# Patient Record
Sex: Male | Born: 1970 | Race: Black or African American | Hispanic: No | Marital: Single | Smoking: Current every day smoker
Health system: Southern US, Community
[De-identification: ages and names within clinical notes are randomized; demographics above are authoritative.]

---

## 2012-04-29 ENCOUNTER — Emergency Department: Payer: Self-pay | Admitting: Emergency Medicine

## 2012-04-29 LAB — COMPREHENSIVE METABOLIC PANEL
Alkaline Phosphatase: 79 U/L (ref 50–136)
Anion Gap: 12 (ref 7–16)
Bilirubin,Total: 0.4 mg/dL (ref 0.2–1.0)
Calcium, Total: 8.7 mg/dL (ref 8.5–10.1)
Chloride: 103 mmol/L (ref 98–107)
Co2: 23 mmol/L (ref 21–32)
EGFR (African American): >60
EGFR (Non-African Amer.): >60
SGOT(AST): 25 U/L (ref 15–37)
SGPT (ALT): 46 U/L (ref 12–78)

## 2012-04-29 LAB — URINALYSIS, COMPLETE
Bacteria: NONE SEEN
Bilirubin,UR: NEGATIVE
Blood: NEGATIVE
Glucose,UR: NEGATIVE mg/dL (ref 0–75)
Ketone: NEGATIVE
Leukocyte Esterase: NEGATIVE
Nitrite: NEGATIVE
Ph: 6 (ref 4.5–8.0)
Protein: NEGATIVE
Specific Gravity: 1.001 (ref 1.003–1.030)
Squamous Epithelial: 1
WBC UR: 1 /HPF (ref 0–5)

## 2012-04-29 LAB — CBC WITH DIFFERENTIAL/PLATELET
Basophil #: 0.1 10*3/uL (ref 0.0–0.1)
Eosinophil #: 0.2 10*3/uL (ref 0.0–0.7)
Lymphocyte #: 2.9 10*3/uL (ref 1.0–3.6)
MCHC: 34 g/dL (ref 32.0–36.0)
MCV: 83 fL (ref 80–100)
Monocyte #: 1 x10 3/mm (ref 0.2–1.0)
Neutrophil #: 5.2 10*3/uL (ref 1.4–6.5)
RBC: 5.65 10*6/uL (ref 4.40–5.90)
RDW: 13.6 % (ref 11.5–14.5)
WBC: 9.4 10*3/uL (ref 3.8–10.6)

## 2014-06-27 ENCOUNTER — Emergency Department: Payer: Self-pay | Admitting: Emergency Medicine

## 2014-07-04 ENCOUNTER — Emergency Department: Payer: Self-pay | Admitting: Student

## 2015-05-01 ENCOUNTER — Emergency Department
Admission: EM | Admit: 2015-05-01 | Discharge: 2015-05-01 | Disposition: A | Discharge status: Home / Self Care | Payer: No Typology Code available for payment source | Attending: Emergency Medicine | Admitting: Emergency Medicine

## 2015-05-01 DIAGNOSIS — K029 Dental caries, unspecified: Secondary | ICD-10-CM

## 2015-05-01 DIAGNOSIS — Z720 Tobacco use: Secondary | ICD-10-CM | POA: Diagnosis not present

## 2015-05-01 DIAGNOSIS — K047 Periapical abscess without sinus: Secondary | ICD-10-CM | POA: Diagnosis not present

## 2015-05-01 DIAGNOSIS — K088 Other specified disorders of teeth and supporting structures: Secondary | ICD-10-CM | POA: Diagnosis present

## 2015-05-01 MED ORDER — PENICILLIN V POTASSIUM 500 MG PO TABS
500.0000 mg | ORAL_TABLET | Freq: Four times a day (QID) | ORAL | Status: DC
Start: 2015-05-01 — End: 2021-02-01

## 2015-05-01 MED ORDER — TRAMADOL HCL 50 MG PO TABS: 50.0000 mg | ORAL_TABLET | Freq: Four times a day (QID) | ORAL | Status: DC | PRN

## 2015-05-01 MED ORDER — IBUPROFEN 800 MG PO TABS: 800.0000 mg | ORAL_TABLET | Freq: Three times a day (TID) | ORAL | Status: DC | PRN

## 2015-05-01 MED ORDER — BUPIVACAINE HCL (PF) 0.5 % IJ SOLN
INTRAMUSCULAR | Status: AC
  Filled 2015-05-01: qty 30

## 2015-05-01 MED ORDER — LIDOCAINE HCL (PF) 1 % IJ SOLN
2.0000 mL | Freq: Once | INTRAMUSCULAR | Status: DC
  Filled 2015-05-01: qty 5

## 2015-05-01 MED ORDER — BUPIVACAINE HCL 0.5 % IJ SOLN
50.0000 mL | Freq: Once | INTRAMUSCULAR | Status: DC
  Filled 2015-05-01: qty 50

## 2015-05-01 NOTE — ED Provider Notes (Signed)
CSN: [id number]     Arrival date & time 05/01/15  1940 History   First MD Initiated Contact with Patient 05/01/15 2037     Chief Complaint  Patient presents with  . Dental Pain     (Consider location/radiation/quality/duration/timing/severity/associated sxs/prior Treatment) HPI  44 year old male presents to emergency department for evaluation of dental pain. Patient states tooth #36 has been causing significant pain and discomfort last 2 days. 2. Previously fractured and has caused 2 days of pain and swelling. He has not been taking any medications. Is able tolerate by mouth well. No fevers does not have a dentist. Pain is 10 out of 10.  No past medical history on file. No past surgical history on file. No family history on file. Social History  Substance Use Topics  . Smoking status: Current Every Day Smoker  . Smokeless tobacco: Not on file  . Alcohol Use: No    Review of Systems  Constitutional: Negative.  Negative for fever and chills.  HENT: Positive for dental problem and facial swelling. Negative for drooling, mouth sores, trouble swallowing and voice change.   Respiratory: Negative for chest tightness and shortness of breath.   Cardiovascular: Negative for chest pain.  Gastrointestinal: Negative for nausea, vomiting, abdominal pain and diarrhea.  Musculoskeletal: Negative for arthralgias, neck pain and neck stiffness.  Skin: Negative.   Psychiatric/Behavioral: Negative for confusion.  All other systems reviewed and are negative.     Allergies  Review of patient's allergies indicates no known allergies.  Home Medications   Prior to Admission medications   Medication Sig Start Date End Date Taking? Authorizing Provider  ibuprofen (ADVIL,MOTRIN) 800 MG tablet Take 1 tablet (800 mg total) by mouth every 8 (eight) hours as needed. 05/01/15   Evon Slack, PA-C  penicillin v potassium (VEETID) 500 MG tablet Take 1 tablet (500 mg total) by mouth 4 (four) times daily.  05/01/15   Evon Slack, PA-C  traMADol (ULTRAM) 50 MG tablet Take 1 tablet (50 mg total) by mouth every 6 (six) hours as needed. 05/01/15   Evon Slack, PA-C   BP 137/96 mmHg  Pulse 82  Temp(Src) 98.4 F (36.9 C) (Oral)  Resp 18  Ht  (1.93 m)  Wt 240 lb (108.863 kg)  BMI 29.23 kg/m2  SpO2 97% Physical Exam  Constitutional: He is oriented to person, place, and time. He appears well-developed and well-nourished. No distress.  HENT:  Head: Normocephalic and atraumatic.  Right Ear: External ear normal.  Left Ear: External ear normal.  Nose: Nose normal.  Mouth/Throat: Uvula is midline and oropharynx is clear and moist. No oral lesions. No trismus in the jaw. Normal dentition. Dental abscesses and dental caries present. No uvula swelling. No oropharyngeal exudate, posterior oropharyngeal edema, posterior oropharyngeal erythema or tonsillar abscesses.    Eyes: EOM are normal.  Neck: Normal range of motion. Neck supple.  Cardiovascular: Normal rate.  Exam reveals no gallop and no friction rub.   No murmur heard. Pulmonary/Chest: Effort normal and breath sounds normal. No respiratory distress.  Neurological: He is alert and oriented to person, place, and time.  Skin: Skin is warm and dry.  Psychiatric: He has a normal mood and affect. His behavior is normal. Thought content normal.    ED Course  Procedures (including critical care time) Labs Review Labs Reviewed - No data to display  Imaging Review No results found. I have personally reviewed and evaluated these images and lab results as part of my  medical decision-making.   EKG Interpretation None      MDM   Final diagnoses:  Pain due to dental caries    44 year old male with dental pain 2 days. He was offered dental block but refused. Patient given penicillin VK, tramadol, ibuprofen. Follow-up with dental clinic first of next week. Return to the ER for any worsening symptoms urgent changes in  health.    Evon Slack, PA-C 05/01/15 2108  Richardean Canal, MD 05/01/15 [phone]

## 2015-05-01 NOTE — ED Notes (Signed)
Dental pain x 4 days, R lower jaw.

## 2015-05-01 NOTE — ED Notes (Signed)
Patient with no complaints at this time. Respirations even and unlabored. Skin warm/dry. Discharge instructions reviewed with patient at this time. Patient given opportunity to voice concerns/ask questions. Patient discharged at this time and left Emergency Department with steady gait.   

## 2015-05-01 NOTE — ED Notes (Signed)
Patient presents to ED with complaint of toothache x 4 days. Points to right lower molar area. No facial swelling noted. Unsure about fever at home. Denies regular dental care.

## 2015-05-01 NOTE — Discharge Instructions (Signed)
Dental Care and Dentist Visits Dental care supports good overall health. Regular dental visits can also help you avoid dental pain, bleeding, infection, and other more serious health problems in the future. It is important to keep the mouth healthy because diseases in the teeth, gums, and other oral tissues can spread to other areas of the body. Some problems, such as diabetes, heart disease, and pre-term labor have been associated with poor oral health.  See your dentist every 6 months. If you experience emergency problems such as a toothache or broken tooth, go to the dentist right away. If you see your dentist regularly, you may catch problems early. It is easier to be treated for problems in the early stages.  WHAT TO EXPECT AT A DENTIST VISIT  Your dentist will look for many common oral health problems and recommend proper treatment. At your regular dental visit, you can expect:  Gentle cleaning of the teeth and gums. This includes scraping and polishing. This helps to remove the sticky substance around the teeth and gums (plaque). Plaque forms in the mouth shortly after eating. Over time, plaque hardens on the teeth as tartar. If tartar is not removed regularly, it can cause problems. Cleaning also helps remove stains.  Periodic X-rays. These pictures of the teeth and supporting bone will help your dentist assess the health of your teeth.  Periodic fluoride treatments. Fluoride is a natural mineral shown to help strengthen teeth. Fluoride treatmentinvolves applying a fluoride gel or varnish to the teeth. It is most commonly done in children.  Examination of the mouth, tongue, jaws, teeth, and gums to look for any oral health problems, such as:  Cavities (dental caries). This is decay on the tooth caused by plaque, sugar, and acid in the mouth. It is best to catch a cavity when it is small.  Inflammation of the gums caused by plaque buildup (gingivitis).  Problems with the mouth or malformed  or misaligned teeth.  Oral cancer or other diseases of the soft tissues or jaws. KEEP YOUR TEETH AND GUMS HEALTHY For healthy teeth and gums, follow these general guidelines as well as your dentist's specific advice:  Have your teeth professionally cleaned at the dentist every 6 months.  Brush twice daily with a fluoride toothpaste.  Floss your teeth daily.  Ask your dentist if you need fluoride supplements, treatments, or fluoride toothpaste.  Eat a healthy diet. Reduce foods and drinks with added sugar.  Avoid smoking. TREATMENT FOR ORAL HEALTH PROBLEMS If you have oral health problems, treatment varies depending on the conditions present in your teeth and gums.  Your caregiver will most likely recommend good oral hygiene at each visit.  For cavities, gingivitis, or other oral health disease, your caregiver will perform a procedure to treat the problem. This is typically done at a separate appointment. Sometimes your caregiver will refer you to another dental specialist for specific tooth problems or for surgery. SEEK IMMEDIATE DENTAL CARE IF:  You have pain, bleeding, or soreness in the gum, tooth, jaw, or mouth area.  A permanent tooth becomes loose or separated from the gum socket.  You experience a blow or injury to the mouth or jaw area. Document Released: 04/06/2011 Document Revised: 10/17/2011 Document Reviewed: 04/06/2011 [facility] Patient Information 2015 [employer], [place]. This information is not intended to replace advice given to you by your health care provider. Make sure you discuss any questions you have with your health care provider.  Dental Caries Dental caries is tooth decay. This  decay can cause a hole in teeth (cavity) that can get bigger and deeper over time. °HOME CARE °· Brush and floss your teeth. Do this at least two times a day. °· Use a fluoride toothpaste. °· Use a mouth rinse if told by your dentist or doctor. °· Eat less sugary and starchy foods.  Drink less sugary drinks. °· Avoid snacking often on sugary and starchy foods. Avoid sipping often on sugary drinks. °· Keep regular checkups and cleanings with your dentist. °· Use fluoride supplements if told by your dentist or doctor. °· Allow fluoride to be applied to teeth if told by your dentist or doctor. °Document Released: 05/03/2008 Document Revised: 12/09/2013 Document Reviewed: 07/27/2012 °ExitCare® Patient Information ©2015 [employer]. This information is not intended to replace advice given to you by your health care provider. Make sure you discuss any questions you have with your health care provider. ° °Dental Pain °A tooth ache may be caused by cavities (tooth decay). Cavities expose the nerve of the tooth to air and hot or cold temperatures. It may come from an infection or abscess (also called a boil or furuncle) around your tooth. It is also often caused by dental caries (tooth decay). This causes the pain you are having. °DIAGNOSIS  °Your caregiver can diagnose this problem by exam. °TREATMENT  °· If caused by an infection, it may be treated with medications which kill germs (antibiotics) and pain medications as prescribed by your caregiver. Take medications as directed. °· Only take over-the-counter or prescription medicines for pain, discomfort, or fever as directed by your caregiver. °· Whether the tooth ache today is caused by infection or dental disease, you should see your dentist as soon as possible for further care. °SEEK MEDICAL CARE IF: °The exam and treatment you received today has been provided on an emergency basis only. This is not a substitute for complete medical or dental care. If your problem worsens or new problems (symptoms) appear, and you are unable to meet with your dentist, call or return to this location. °SEEK IMMEDIATE MEDICAL CARE IF:  °· You have a fever. °· You develop redness and swelling of your face, jaw, or neck. °· You are unable to open your mouth. °· You  have severe pain uncontrolled by pain medicine. °MAKE SURE YOU:  °· Understand these instructions. °· Will watch your condition. °· Will get help right away if you are not doing well or get worse. °Document Released: 07/25/2005 Document Revised: 10/17/2011 Document Reviewed: 03/12/2008 °ExitCare® Patient Information ©2015 [employer]. This information is not intended to replace advice given to you by your health care provider. Make sure you discuss any questions you have with your health care provider. ° °

## 2017-01-01 ENCOUNTER — Emergency Department: Payer: Self-pay

## 2017-01-01 ENCOUNTER — Encounter: Payer: Self-pay | Admitting: Emergency Medicine

## 2017-01-01 ENCOUNTER — Emergency Department
Admission: EM | Admit: 2017-01-01 | Discharge: 2017-01-01 | Disposition: A | Discharge status: Home / Self Care | Payer: Self-pay | Attending: Emergency Medicine | Admitting: Emergency Medicine

## 2017-01-01 DIAGNOSIS — F17200 Nicotine dependence, unspecified, uncomplicated: Secondary | ICD-10-CM | POA: Insufficient documentation

## 2017-01-01 DIAGNOSIS — M25562 Pain in left knee: Secondary | ICD-10-CM

## 2017-01-01 DIAGNOSIS — Z23 Encounter for immunization: Secondary | ICD-10-CM | POA: Insufficient documentation

## 2017-01-01 DIAGNOSIS — S80252A Superficial foreign body, left knee, initial encounter: Secondary | ICD-10-CM

## 2017-01-01 DIAGNOSIS — L089 Local infection of the skin and subcutaneous tissue, unspecified: Secondary | ICD-10-CM

## 2017-01-01 DIAGNOSIS — W228XXD Striking against or struck by other objects, subsequent encounter: Secondary | ICD-10-CM | POA: Insufficient documentation

## 2017-01-01 DIAGNOSIS — M25462 Effusion, left knee: Secondary | ICD-10-CM

## 2017-01-01 MED ORDER — CEPHALEXIN 500 MG PO CAPS: 500.0000 mg | ORAL_CAPSULE | Freq: Four times a day (QID) | ORAL | 0 refills | Status: AC

## 2017-01-01 MED ORDER — TETANUS-DIPHTH-ACELL PERTUSSIS 5-2.5-18.5 LF-MCG/0.5 IM SUSP
0.5000 mL | Freq: Once | INTRAMUSCULAR | Status: AC
  Administered 2017-01-01: 0.5 mL via INTRAMUSCULAR
  Filled 2017-01-01: qty 0.5

## 2017-01-01 NOTE — ED Triage Notes (Signed)
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

## 2017-01-01 NOTE — ED Provider Notes (Signed)
[facility] Emergency Department Provider Note   ____________________________________________   I have reviewed the triage vital signs and the nursing notes.   HISTORY  Chief Complaint Knee Pain and Joint Swelling    HPI Clayton Graham is a 46 y.o. male presented with left knee pain, swelling, erythema, and difficulty ranging his knee secondary to previous symptoms over the last 3 days. Patient denies any recent trauma to the left knee and has no remote history of any injury. Patient is a [employer] architecttruck driver and is unsure of when he hit his knee if that caused the symptoms. Patient recalls approximately 1 week ago getting a metal splinter in his right upper thigh and removing it without receiving any treatment. Patient denies history of gout, rheumatoid arthritis, no recent illicit drug use, no recent surgeries. Patient denies fever, chills, headache, vision changes, chest pain, chest tightness, shortness of breath, abdominal pain, nausea and vomiting.  History reviewed. No pertinent past medical history.  There are no active problems to display for this patient.   History reviewed. No pertinent surgical history.  Prior to Admission medications   Medication Sig Start Date End Date Taking? Authorizing Provider  cephALEXin (KEFLEX) 500 MG capsule Take 1 capsule (500 mg total) by mouth 4 (four) times daily. 01/01/17 01/11/17  Sharnell Knight M, PA-C  ibuprofen (ADVIL,MOTRIN) 800 MG tablet Take 1 tablet (800 mg total) by mouth every 8 (eight) hours as needed. 05/01/15   Evon SlackGaines, Thomas C, PA-C  penicillin v potassium (VEETID) 500 MG tablet Take 1 tablet (500 mg total) by mouth 4 (four) times daily. 05/01/15   Evon SlackGaines, Thomas C, PA-C  traMADol (ULTRAM) 50 MG tablet Take 1 tablet (50 mg total) by mouth every 6 (six) hours as needed. 05/01/15   Evon SlackGaines, Thomas C, PA-C    Allergies Patient has no known allergies.  History reviewed. No pertinent family history.  Social  History Social History  Substance Use Topics  . Smoking status: Current Every Day Smoker  . Smokeless tobacco: Never Used  . Alcohol use No    Review of Systems Constitutional:  Negative for fever/chills Eyes: No visual changes. Cardiovascular: Denies chest pain. Respiratory: Denies cough Denies shortness of breath. Gastrointestinal: No abdominal pain.  No nausea, vomiting, diarrhea. Genitourinary: Negative for dysuria. Musculoskeletal: Left knee pain, swelling. Skin: Negative for rash. Left lower extremity erythematous, warm to the touch. ____________________________________________   PHYSICAL EXAM:  VITAL SIGNS: ED Triage Vitals  Enc Vitals Group     BP 01/01/17 1114 (!) 149/88     Pulse Rate 01/01/17 1114 72     Resp 01/01/17 1114 18     Temp 01/01/17 1114 98.5 F (36.9 C)     Temp Source 01/01/17 1114 Oral     SpO2 01/01/17 1114 96 %     Weight 01/01/17 1114 250 lb (113.4 kg)     Height 01/01/17 1114 6\' 4"  (1.93 m)     Head Circumference --      Peak Flow --      Pain Score 01/01/17 1113 7     Pain Loc --      Pain Edu? --      Excl. in GC? --     Constitutional: Alert and oriented. Well appearing and in no acute distress.  Head: Normocephalic and atraumatic. Eyes: Conjunctivae are normal.  Cardiovascular: Normal rate, regular rhythm. Normal distal pulses. Respiratory: Normal respiratory effort.  Musculoskeletal: Right knee range of motion limited by pain and knee  joint effusion. Visible swelling along along left knee and lower leg. Neurologic: Normal speech and language. No gross focal neurologic deficits are appreciated. Skin:  Skin is dry and intact. No rash noted. Left lower extremity approximately knee joint down very warm to the touch. ____________________________________________   LABS (all labs ordered are listed, but only abnormal results are displayed)  Labs Reviewed - No data to  display ____________________________________________  EKG none ____________________________________________  RADIOLOGY DG left knee complete FINDINGS: No evidence of fracture, dislocation, or joint effusion. Minimal narrowing of medial joint space is noted. Soft tissues are unremarkable.  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 No acute abnormality seen the left knee. ____________________________________________   PROCEDURES  Procedure(s) performed: no   Critical Care performed: no ____________________________________________   INITIAL IMPRESSION / ASSESSMENT AND PLAN / ED COURSE  Pertinent labs & imaging results that were available during my care of the patient were reviewed by me and considered in my medical decision making (see chart for details).  Patient presents with left knee pain, visible swelling, joint effusion and erythema along the left lower extremity. Symptoms are likely associated with infection in the left knee joint. Patient reported approximate one week ago removing a metal splinter out of the thigh area of the left leg without receiving treatment or tetanus booster. Patient will be empirically treated with cephalexin. Patient received a tetanus booster today. Physical exam and imaging are reassuring for no acute fractures or significant arthritic changes.. Highly encourage patient to continue monitoring the left lower extremity for worsening of symptoms. Patient informed of clinical course, understand medical decision-making process, and agree with plan.  Patient was advised to follow up with PCP and was also advised to return to the emergency department for symptoms that change or worsen.      ____________________________________________   FINAL CLINICAL IMPRESSION(S) / ED DIAGNOSES  Final diagnoses:  Acute pain of left knee  Superficial foreign body of left knee with infection, initial encounter  Effusion of left knee        NEW MEDICATIONS STARTED DURING THIS VISIT:  Discharge Medication List as of 01/01/2017 12:40 PM    START taking these medications   Details  cephALEXin (KEFLEX) 500 MG capsule Take 1 capsule (500 mg total) by mouth 4 (four) times daily., Starting Sun 01/01/2017, Until Wed 01/11/2017, Print         Note:  This document was prepared using Dragon voice recognition software and may include unintentional dictation errors.   Jourdyn Hasler, Karl Pock 01/01/17 2037    Phineas Semen, MD 01/08/17 [phone]

## 2018-01-15 IMAGING — CR DG KNEE COMPLETE 4+V*L*
1 series · 4 of 4 positions shown · non-contrast
Comparison: None.

CLINICAL DATA: Left knee pain and swelling for 1 week without known
injury.

EXAM:
LEFT KNEE - COMPLETE 4+ VIEW

[Series 1: dg knee complete 4 views left · 0.14mm/px · 4 of 4 slices shown]
[im 1/4]
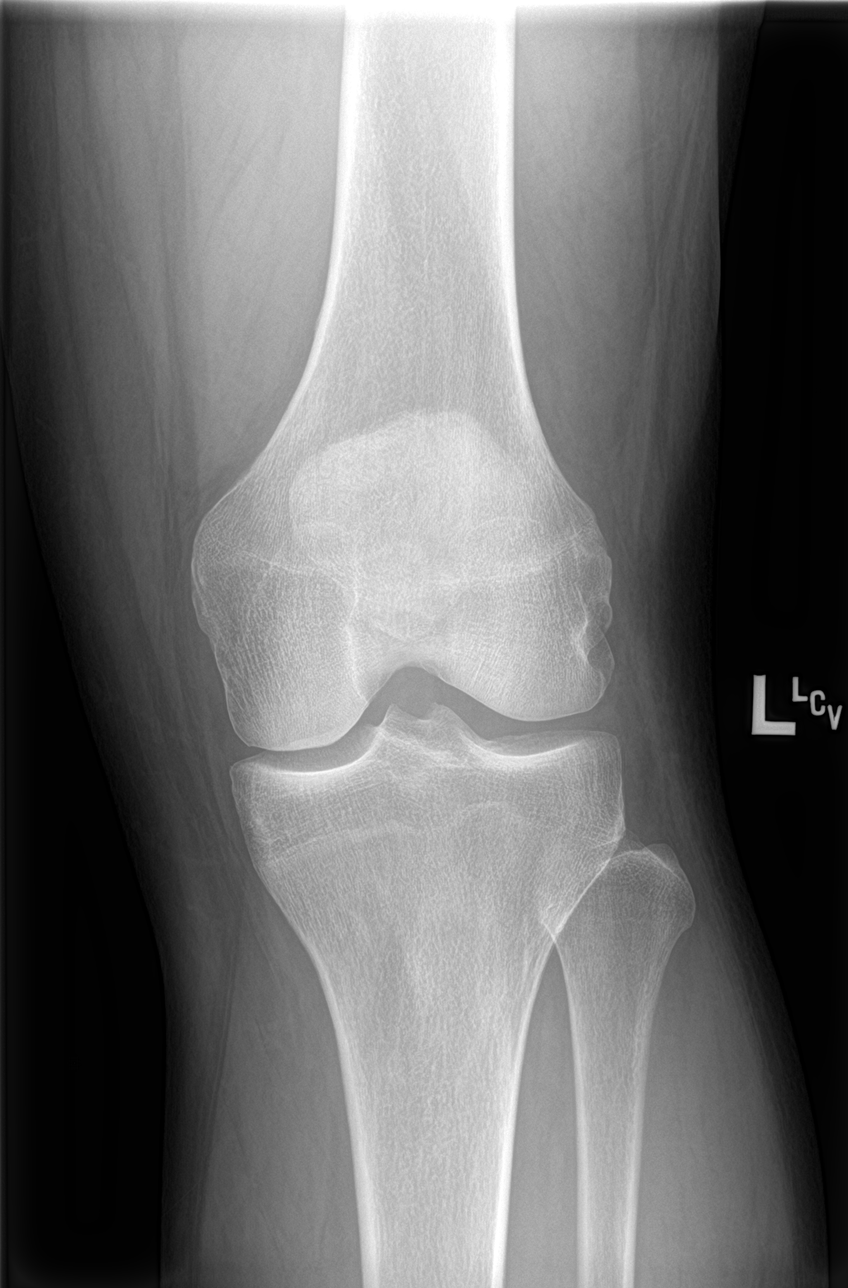
[im 2/4]
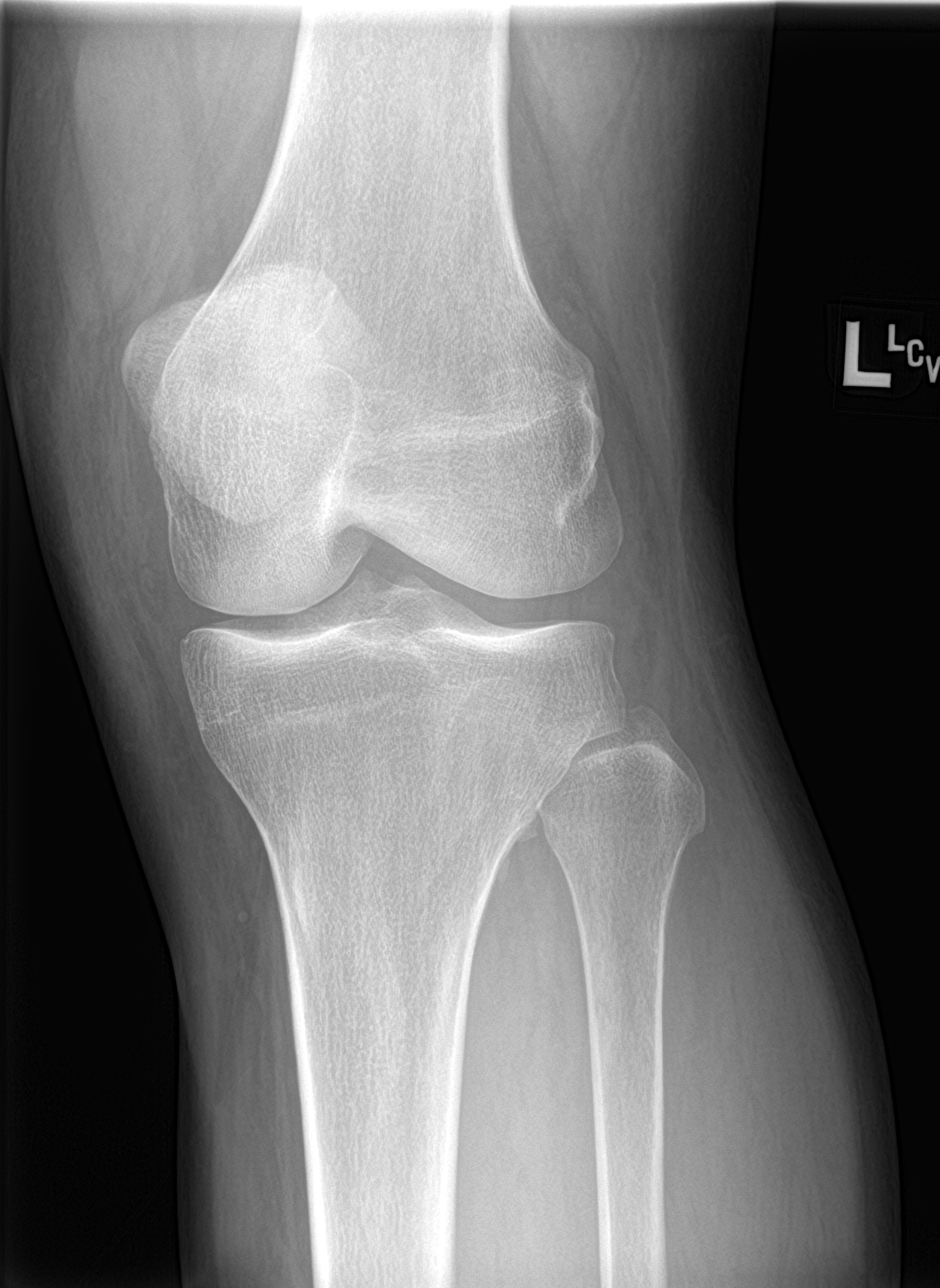
[im 3/4]
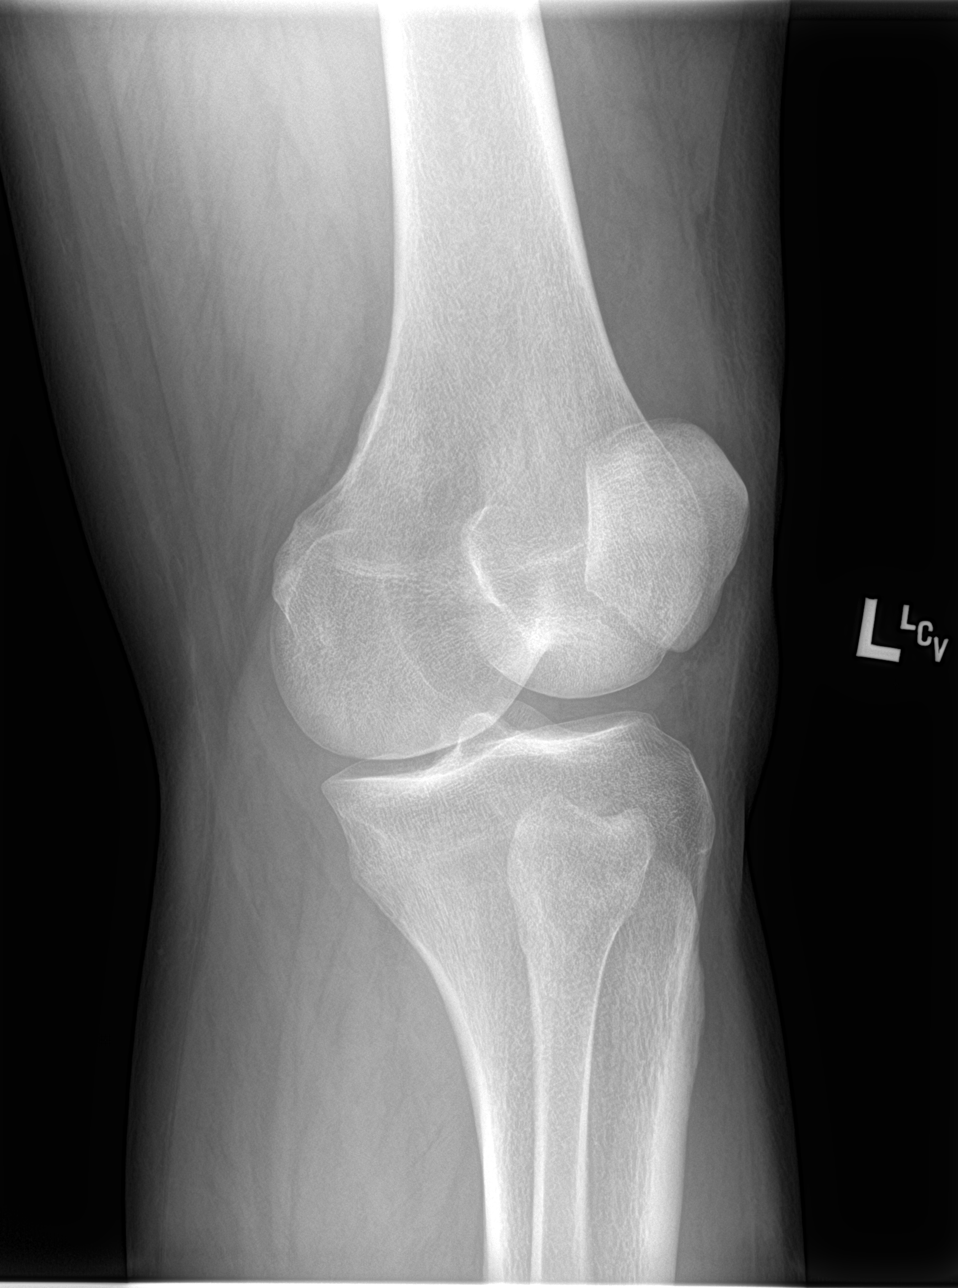
[im 4/4]
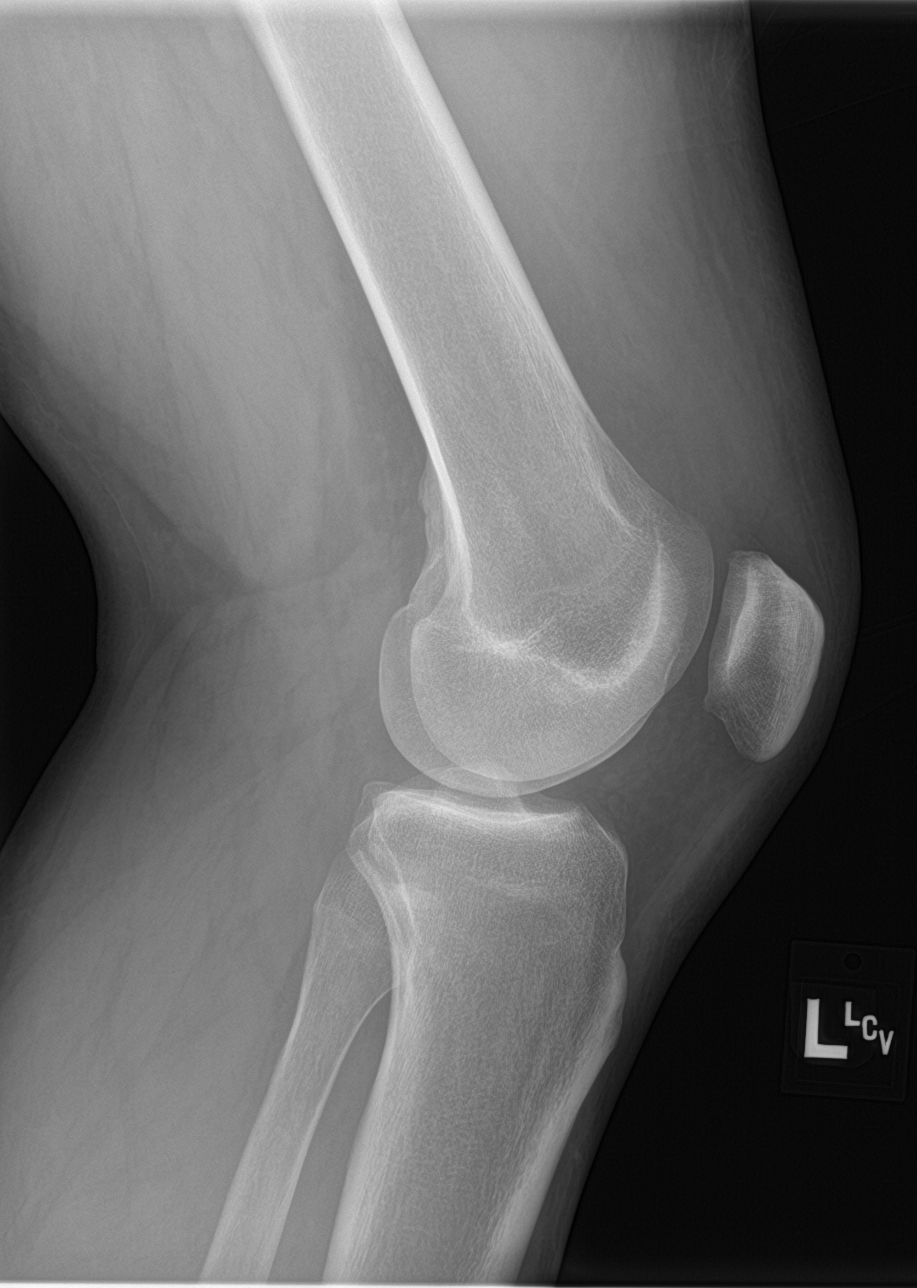

[4 of 4 positions shown; findings below may reference images not displayed]

FINDINGS: No evidence of fracture, dislocation, or joint effusion. Minimal
narrowing of medial joint space is noted. Soft tissues are
unremarkable.
IMPRESSION: Minimal degenerative joint disease is noted medially. No acute
abnormality seen the left knee.

## 2018-09-23 ENCOUNTER — Emergency Department
Admission: EM | Admit: 2018-09-23 | Discharge: 2018-09-23 | Disposition: A | Discharge status: Home / Self Care | Payer: Self-pay | Attending: Student in an Organized Health Care Education/Training Program | Admitting: Student in an Organized Health Care Education/Training Program

## 2018-09-23 ENCOUNTER — Other Ambulatory Visit: Payer: Self-pay

## 2018-09-23 ENCOUNTER — Emergency Department: Payer: Self-pay

## 2018-09-23 DIAGNOSIS — F17210 Nicotine dependence, cigarettes, uncomplicated: Secondary | ICD-10-CM | POA: Insufficient documentation

## 2018-09-23 DIAGNOSIS — S0990XA Unspecified injury of head, initial encounter: Secondary | ICD-10-CM | POA: Insufficient documentation

## 2018-09-23 DIAGNOSIS — Y92008 Other place in unspecified non-institutional (private) residence as the place of occurrence of the external cause: Secondary | ICD-10-CM | POA: Insufficient documentation

## 2018-09-23 DIAGNOSIS — S0101XA Laceration without foreign body of scalp, initial encounter: Secondary | ICD-10-CM | POA: Insufficient documentation

## 2018-09-23 DIAGNOSIS — Y939 Activity, unspecified: Secondary | ICD-10-CM | POA: Insufficient documentation

## 2018-09-23 DIAGNOSIS — Y999 Unspecified external cause status: Secondary | ICD-10-CM | POA: Insufficient documentation

## 2018-09-23 DIAGNOSIS — Z23 Encounter for immunization: Secondary | ICD-10-CM | POA: Insufficient documentation

## 2018-09-23 DIAGNOSIS — Y33XXXA Other specified events, undetermined intent, initial encounter: Secondary | ICD-10-CM | POA: Insufficient documentation

## 2018-09-23 LAB — CBC WITH DIFFERENTIAL/PLATELET
Abs Immature Granulocytes: 0.07 10*3/uL (ref 0.00–0.07)
Basophils Absolute: 0.1 10*3/uL (ref 0.0–0.1)
Basophils Relative: 1 %
Eosinophils Absolute: 0.3 10*3/uL (ref 0.0–0.5)
Eosinophils Relative: 3 %
HCT: 47.7 % (ref 39.0–52.0)
Hemoglobin: 15.3 g/dL (ref 13.0–17.0)
Immature Granulocytes: 1 %
Lymphocytes Relative: 43 %
Lymphs Abs: 3.4 10*3/uL (ref 0.7–4.0)
MCH: 26.4 pg (ref 26.0–34.0)
MCHC: 32.1 g/dL (ref 30.0–36.0)
MCV: 82.2 fL (ref 80.0–100.0)
Monocytes Absolute: 0.9 10*3/uL (ref 0.1–1.0)
Monocytes Relative: 11 %
Neutro Abs: 3.3 10*3/uL (ref 1.7–7.7)
Neutrophils Relative %: 41 %
Platelets: 344 10*3/uL (ref 150–400)
RBC: 5.8 MIL/uL (ref 4.22–5.81)
RDW: 14 % (ref 11.5–15.5)
WBC: 7.9 10*3/uL (ref 4.0–10.5)
nRBC: 0 % (ref 0.0–0.2)

## 2018-09-23 LAB — COMPREHENSIVE METABOLIC PANEL
ALT: 41 U/L (ref 0–44)
AST: 23 U/L (ref 15–41)
Albumin: 4.3 g/dL (ref 3.5–5.0)
Alkaline Phosphatase: 60 U/L (ref 38–126)
Anion gap: 7 (ref 5–15)
BUN: 11 mg/dL (ref 6–20)
CO2: 26 mmol/L (ref 22–32)
Calcium: 9.3 mg/dL (ref 8.9–10.3)
Chloride: 105 mmol/L (ref 98–111)
Creatinine, Ser: 0.88 mg/dL (ref 0.61–1.24)
GFR calc Af Amer: >60 mL/min (ref >60)
GFR calc non Af Amer: >60 mL/min (ref >60)
Glucose, Bld: 99 mg/dL (ref 70–99)
Potassium: 3.7 mmol/L (ref 3.5–5.1)
Sodium: 138 mmol/L (ref 135–145)
Total Bilirubin: 0.7 mg/dL (ref 0.3–1.2)
Total Protein: 7.9 g/dL (ref 6.5–8.1)

## 2018-09-23 LAB — TYPE AND SCREEN
ABO/RH(D): O POS
Antibody Screen: NEGATIVE

## 2018-09-23 LAB — URINE DRUG SCREEN, QUALITATIVE (ARMC ONLY)
Amphetamines, Ur Screen: NOT DETECTED
Barbiturates, Ur Screen: NOT DETECTED
Benzodiazepine, Ur Scrn: NOT DETECTED
Cannabinoid 50 Ng, Ur ~~LOC~~: NOT DETECTED
Cocaine Metabolite,Ur ~~LOC~~: NOT DETECTED
MDMA (Ecstasy)Ur Screen: NOT DETECTED
Methadone Scn, Ur: NOT DETECTED
Opiate, Ur Screen: NOT DETECTED
Phencyclidine (PCP) Ur S: NOT DETECTED
Tricyclic, Ur Screen: NOT DETECTED

## 2018-09-23 MED ORDER — IBUPROFEN 800 MG PO TABS: 800.0000 mg | ORAL_TABLET | Freq: Three times a day (TID) | ORAL | 0 refills | Status: DC | PRN

## 2018-09-23 MED ORDER — CEPHALEXIN 500 MG PO CAPS: 500.0000 mg | ORAL_CAPSULE | Freq: Three times a day (TID) | ORAL | 0 refills | Status: DC

## 2018-09-23 MED ORDER — OXYCODONE-ACETAMINOPHEN 5-325 MG PO TABS
1.0000 | ORAL_TABLET | Freq: Once | ORAL | Status: DC
  Filled 2018-09-23: qty 1

## 2018-09-23 MED ORDER — TETANUS-DIPHTH-ACELL PERTUSSIS 5-2.5-18.5 LF-MCG/0.5 IM SUSP
0.5000 mL | Freq: Once | INTRAMUSCULAR | Status: AC
  Administered 2018-09-23: 0.5 mL via INTRAMUSCULAR
  Filled 2018-09-23: qty 0.5

## 2018-09-23 MED ORDER — ONDANSETRON 4 MG PO TBDP
4.0000 mg | ORAL_TABLET | Freq: Once | ORAL | Status: DC
  Filled 2018-09-23: qty 1

## 2018-09-23 MED ORDER — KETOROLAC TROMETHAMINE 30 MG/ML IJ SOLN
30.0000 mg | Freq: Once | INTRAMUSCULAR | Status: AC
  Administered 2018-09-23: 30 mg via INTRAMUSCULAR
  Filled 2018-09-23: qty 1

## 2018-09-23 MED ORDER — LIDOCAINE HCL 1 % IJ SOLN
5.0000 mL | Freq: Once | INTRAMUSCULAR | Status: AC
  Administered 2018-09-23: 5 mL

## 2018-09-23 MED ORDER — LIDOCAINE HCL (PF) 1 % IJ SOLN
INTRAMUSCULAR | Status: AC
  Filled 2018-09-23: qty 5

## 2018-09-23 NOTE — ED Notes (Signed)
Sister denies use of drugs or hx of drugs. States pt is just nervous.   Pt states he was home alone sleeping on couch and woke up with head bleeding. Lives in an apartment by self. Pt called his mom who brought him to ED.  Multiple family members in room. Informed only 2 at a time. Mom and sister remain at bedside. Cousins went to lobby at this time.

## 2018-09-23 NOTE — ED Notes (Signed)
Police in room speaking with pt.

## 2018-09-23 NOTE — ED Triage Notes (Signed)
Pt states he was sleeping on cough and woke up and posterior head was bleeding. Denies falling. Denies being hit in head. Denies blood thinner use. A&O, in wheelchair.
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
## 2018-09-23 NOTE — ED Notes (Signed)
Patient transported to CT 

## 2021-02-01 ENCOUNTER — Ambulatory Visit
Admission: EM | Admit: 2021-02-01 | Discharge: 2021-02-01 | Disposition: A | Discharge status: Home / Self Care | Payer: Self-pay | Attending: Emergency Medicine | Admitting: Emergency Medicine

## 2021-02-01 ENCOUNTER — Other Ambulatory Visit: Payer: Self-pay

## 2021-02-01 DIAGNOSIS — S5002XA Contusion of left elbow, initial encounter: Secondary | ICD-10-CM

## 2021-02-01 DIAGNOSIS — S39012A Strain of muscle, fascia and tendon of lower back, initial encounter: Secondary | ICD-10-CM

## 2021-02-01 DIAGNOSIS — S161XXA Strain of muscle, fascia and tendon at neck level, initial encounter: Secondary | ICD-10-CM

## 2021-02-01 MED ORDER — BACLOFEN 10 MG PO TABS: 10.0000 mg | ORAL_TABLET | Freq: Three times a day (TID) | ORAL | 0 refills | Status: DC

## 2021-02-01 MED ORDER — TRAMADOL HCL 50 MG PO TABS: 100.0000 mg | ORAL_TABLET | Freq: Four times a day (QID) | ORAL | 0 refills | Status: DC | PRN

## 2021-02-01 MED ORDER — IBUPROFEN 600 MG PO TABS: 600.0000 mg | ORAL_TABLET | Freq: Four times a day (QID) | ORAL | 0 refills | Status: DC | PRN

## 2021-02-01 NOTE — Discharge Instructions (Addendum)
Take the ibuprofen, 600 mg every 6 hours with food, on a schedule for the next 48 hours and then back off to as-needed basis.  Use the baclofen, 10 mg every 8 hours, to help with muscle spasm.  Also taken on a schedule for the next 48 hours and then as needed.  Use the tramadol as needed for severe pain.  Increase your oral fluid intake to help keep your secretions thin and help your body clear the lactic acid from your muscles.  Follow the back exercises given at discharge.
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
## 2021-02-05 ENCOUNTER — Other Ambulatory Visit: Payer: Self-pay

## 2021-02-05 ENCOUNTER — Ambulatory Visit
Admission: EM | Admit: 2021-02-05 | Discharge: 2021-02-05 | Disposition: A | Discharge status: Home / Self Care | Payer: Self-pay | Attending: Family Medicine | Admitting: Family Medicine

## 2021-02-05 DIAGNOSIS — M7918 Myalgia, other site: Secondary | ICD-10-CM

## 2021-02-05 MED ORDER — MELOXICAM 15 MG PO TABS: 15.0000 mg | ORAL_TABLET | Freq: Every day | ORAL | 0 refills | Status: AC | PRN

## 2021-02-05 NOTE — Discharge Instructions (Addendum)
Rest, heat.  Medication as prescribed.  Take care  Dr. Lesia Monica  
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
